# Patient Record
Sex: Male | Born: 1995 | Race: Black or African American | Hispanic: No | Marital: Single | Smoking: Never smoker
Health system: Southern US, Community
[De-identification: ages and names within clinical notes are randomized; demographics above are authoritative.]

## PROBLEM LIST (undated history)

## (undated) DIAGNOSIS — B019 Varicella without complication: Secondary | ICD-10-CM

## (undated) HISTORY — DX: Varicella without complication: B01.9

---
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
## 2013-10-23 ENCOUNTER — Ambulatory Visit (INDEPENDENT_AMBULATORY_CARE_PROVIDER_SITE_OTHER): Payer: BC Managed Care – PPO | Admitting: Family Medicine

## 2013-10-23 ENCOUNTER — Encounter: Payer: Self-pay | Admitting: Family Medicine

## 2013-10-23 VITALS — BP 127/65 | HR 71 | Temp 98.3°F | Ht 71.5 in | Wt 180.0 lb

## 2013-10-23 DIAGNOSIS — L259 Unspecified contact dermatitis, unspecified cause: Secondary | ICD-10-CM

## 2013-10-23 DIAGNOSIS — L309 Dermatitis, unspecified: Secondary | ICD-10-CM

## 2013-10-23 MED ORDER — TRIAMCINOLONE ACETONIDE 0.1 % EX CREA: 1.0000 "application " | TOPICAL_CREAM | Freq: Two times a day (BID) | CUTANEOUS | Status: DC

## 2013-10-23 NOTE — Progress Notes (Signed)
Pre visit review using our clinic review tool, if applicable. No additional management support is needed unless otherwise documented below in the visit note. 

## 2013-10-23 NOTE — Progress Notes (Signed)
   Subjective:    Patient ID: Reginald Livingston, male    DOB: 03-Dec-1995, 18 y.o.   MRN: [id number]  HPI Here for 2 weeks of itchy patches of skin on the arms and trunk.    Review of Systems  Constitutional: Negative.   Skin: Positive for rash.       Objective:   Physical Exam  Constitutional: He appears well-developed and well-nourished.  Skin:  Patches of macular erythema on the arms and trunk          Assessment & Plan:  Try triamcinolone cream.

## 2014-09-25 ENCOUNTER — Encounter: Payer: Self-pay | Admitting: Family Medicine

## 2014-09-25 ENCOUNTER — Ambulatory Visit (INDEPENDENT_AMBULATORY_CARE_PROVIDER_SITE_OTHER): Payer: BC Managed Care – PPO | Admitting: Family Medicine

## 2014-09-25 VITALS — BP 141/72 | HR 61 | Temp 98.1°F | Ht 71.5 in | Wt 179.0 lb

## 2014-09-25 DIAGNOSIS — Z202 Contact with and (suspected) exposure to infections with a predominantly sexual mode of transmission: Secondary | ICD-10-CM | POA: Diagnosis not present

## 2014-09-25 DIAGNOSIS — Z0000 Encounter for general adult medical examination without abnormal findings: Secondary | ICD-10-CM

## 2014-09-25 NOTE — Progress Notes (Signed)
Pre visit review using our clinic review tool, if applicable. No additional management support is needed unless otherwise documented below in the visit note. 

## 2014-09-25 NOTE — Progress Notes (Signed)
   Subjective:    Patient ID: Reginald Livingston, male    DOB: [birth year], 19 y.o.   MRN: [id number]  HPI 19 yr old male for a cpx. He feels well. He does ask for some STD screens for peace of mind. He is a Holiday representativesenior in college but he plans to start graduate school next year for an MBA degree.    Review of Systems  Constitutional: Negative.   HENT: Negative.   Eyes: Negative.   Respiratory: Negative.   Cardiovascular: Negative.   Gastrointestinal: Negative.   Genitourinary: Negative.   Musculoskeletal: Negative.   Skin: Negative.   Neurological: Negative.   Psychiatric/Behavioral: Negative.        Objective:   Physical Exam  Constitutional: He is oriented to person, place, and time. He appears well-developed and well-nourished. No distress.  HENT:  Head: Normocephalic and atraumatic.  Right Ear: External ear normal.  Left Ear: External ear normal.  Nose: Nose normal.  Mouth/Throat: Oropharynx is clear and moist. No oropharyngeal exudate.  Eyes: Conjunctivae and EOM are normal. Pupils are equal, round, and reactive to light. Right eye exhibits no discharge. Left eye exhibits no discharge. No scleral icterus.  Neck: Neck supple. No JVD present. No tracheal deviation present. No thyromegaly present.  Cardiovascular: Normal rate, regular rhythm, normal heart sounds and intact distal pulses.  Exam reveals no gallop and no friction rub.   No murmur heard. Pulmonary/Chest: Effort normal and breath sounds normal. No respiratory distress. He has no wheezes. He has no rales. He exhibits no tenderness.  Abdominal: Soft. Bowel sounds are normal. He exhibits no distension and no mass. There is no tenderness. There is no rebound and no guarding.  Genitourinary: Rectum normal, prostate normal and penis normal. Guaiac negative stool. No penile tenderness.  Musculoskeletal: Normal range of motion. He exhibits no edema or tenderness.  Lymphadenopathy:    He has no cervical adenopathy.  Neurological: He  is alert and oriented to person, place, and time. He has normal reflexes. No cranial nerve deficit. He exhibits normal muscle tone. Coordination normal.  Skin: Skin is warm and dry. No rash noted. He is not diaphoretic. No erythema. No pallor.  Psychiatric: He has a normal mood and affect. His behavior is normal. Judgment and thought content normal.          Assessment & Plan:  Well exam. Get some STD screens today.

## 2014-09-26 LAB — HIV ANTIBODY (ROUTINE TESTING W REFLEX): HIV 1&2 Ab, 4th Generation: NONREACTIVE

## 2014-09-26 LAB — HEPATITIS B SURFACE ANTIGEN: Hepatitis B Surface Ag: NEGATIVE

## 2014-09-26 LAB — GC/CHLAMYDIA PROBE AMP, URINE
Chlamydia, Swab/Urine, PCR: NEGATIVE
GC Probe Amp, Urine: NEGATIVE

## 2014-09-26 LAB — RPR

## 2014-09-26 LAB — HEPATITIS C ANTIBODY: HCV AB: NEGATIVE

## 2014-09-26 LAB — HSV(HERPES SIMPLEX VRS) I + II AB-IGG
HSV 1 Glycoprotein G Ab, IgG: <0.1 IV
HSV 2 Glycoprotein G Ab, IgG: <0.1 IV

## 2014-10-29 ENCOUNTER — Encounter: Payer: Self-pay | Admitting: Family Medicine

## 2014-10-29 ENCOUNTER — Ambulatory Visit (INDEPENDENT_AMBULATORY_CARE_PROVIDER_SITE_OTHER): Payer: BC Managed Care – PPO | Admitting: Family Medicine

## 2014-10-29 VITALS — BP 124/72 | HR 81 | Temp 97.9°F | Wt 182.0 lb

## 2014-10-29 DIAGNOSIS — Z202 Contact with and (suspected) exposure to infections with a predominantly sexual mode of transmission: Secondary | ICD-10-CM

## 2014-10-29 NOTE — Progress Notes (Signed)
Pre visit review using our clinic review tool, if applicable. No additional management support is needed unless otherwise documented below in the visit note. 

## 2014-10-29 NOTE — Progress Notes (Signed)
   Subjective:    Patient ID: Reginald Livingston, male    DOB: 1996-01-03, 19 y.o.   MRN: [id number]  HPI Here to be checked for possible STDs. 3 days ago he had unprotected sex and now he is worried about it. He denies any symptoms.    Review of Systems  Constitutional: Negative.   Genitourinary: Negative.        Objective:   Physical Exam  Constitutional: He appears well-developed and well-nourished.          Assessment & Plan:  Test for STDs. We strongly advised him to wear condoms in the future.

## 2014-10-30 LAB — HSV(HERPES SIMPLEX VRS) I + II AB-IGG
HSV 1 Glycoprotein G Ab, IgG: <0.1 IV
HSV 2 GLYCOPROTEIN G AB, IGG: 0.12 IV

## 2014-10-30 LAB — RPR

## 2014-10-30 LAB — GC/CHLAMYDIA PROBE AMP, URINE
CHLAMYDIA, SWAB/URINE, PCR: NEGATIVE
GC Probe Amp, Urine: NEGATIVE

## 2014-10-30 LAB — HEPATITIS B SURFACE ANTIGEN: Hepatitis B Surface Ag: NEGATIVE

## 2014-10-30 LAB — HIV ANTIBODY (ROUTINE TESTING W REFLEX): HIV: NONREACTIVE

## 2014-10-30 LAB — HEPATITIS C ANTIBODY: HCV Ab: NEGATIVE

## 2015-04-08 ENCOUNTER — Encounter: Payer: Self-pay | Admitting: Family Medicine

## 2015-04-08 ENCOUNTER — Ambulatory Visit (INDEPENDENT_AMBULATORY_CARE_PROVIDER_SITE_OTHER): Payer: BC Managed Care – PPO | Admitting: Family Medicine

## 2015-04-08 VITALS — BP 133/76 | HR 65 | Temp 98.8°F | Ht 71.5 in | Wt 183.0 lb

## 2015-04-08 DIAGNOSIS — B360 Pityriasis versicolor: Secondary | ICD-10-CM

## 2015-04-08 MED ORDER — FLUCONAZOLE 100 MG PO TABS: 100.0000 mg | ORAL_TABLET | Freq: Two times a day (BID) | ORAL | Status: DC

## 2015-04-08 NOTE — Progress Notes (Signed)
Pre visit review using our clinic review tool, if applicable. No additional management support is needed unless otherwise documented below in the visit note. 

## 2015-04-08 NOTE — Progress Notes (Signed)
   Subjective:    Patient ID: Reginald Livingston, male    DOB: Nov 01, 1995, 19 y.o.   MRN: [id number]  HPI Here for one month of a rash on the neck and upper back. It does not itch. Triamcinolone cream does not help.   Review of Systems  Constitutional: Negative.   Skin: Positive for rash.       Objective:   Physical Exam  Constitutional: He appears well-developed and well-nourished.  Skin:  Large patches of hypopigmented skin on  The upper back           Assessment & Plan:  Tinea versicolor, treat with Selsun Blu shampoo BID. Also take Fluconazole tabs bid for 30 days

## 2015-06-16 ENCOUNTER — Telehealth: Payer: Self-pay | Admitting: Family Medicine

## 2015-06-16 ENCOUNTER — Ambulatory Visit: Payer: BC Managed Care – PPO | Admitting: Family Medicine

## 2015-06-16 NOTE — Telephone Encounter (Signed)
Pt was on schedule to see Dr. Clent Ridges today, I called and left a voice message for pt to reschedule this missed appointment.

## 2015-06-17 ENCOUNTER — Other Ambulatory Visit: Payer: Self-pay | Admitting: Family Medicine

## 2015-06-17 ENCOUNTER — Encounter: Payer: Self-pay | Admitting: Family Medicine

## 2015-06-17 ENCOUNTER — Ambulatory Visit (INDEPENDENT_AMBULATORY_CARE_PROVIDER_SITE_OTHER): Payer: BC Managed Care – PPO | Admitting: Family Medicine

## 2015-06-17 VITALS — BP 130/72 | HR 66 | Temp 98.7°F | Ht 71.5 in | Wt 180.0 lb

## 2015-06-17 DIAGNOSIS — Z209 Contact with and (suspected) exposure to unspecified communicable disease: Secondary | ICD-10-CM | POA: Diagnosis not present

## 2015-06-17 NOTE — Progress Notes (Signed)
   Subjective:    Patient ID: Reginald Livingston, male    DOB: [birth year], 20 y.o.   MRN: [id number]  HPI Here asking for STD testing. He has no symptoms, but he and his girlfriend split up a few months ago and each of them saw different partners during this time. Now the two of them are back together and they both agreed to get STD testing to make the other one feel safe.    Review of Systems  Constitutional: Negative.   Respiratory: Negative.   Cardiovascular: Negative.   Genitourinary: Negative.   Neurological: Negative.        Objective:   Physical Exam  Constitutional: He appears well-developed and well-nourished.  Cardiovascular: Normal rate, regular rhythm, normal heart sounds and intact distal pulses.   Pulmonary/Chest: Effort normal and breath sounds normal.  Genitourinary: Rectum normal and penis normal.  Skin: No rash noted.          Assessment & Plan:  STD testing. We will send him for a full panel.

## 2015-06-17 NOTE — Progress Notes (Signed)
Pre visit review using our clinic review tool, if applicable. No additional management support is needed unless otherwise documented below in the visit note. 

## 2015-06-18 LAB — HEPATITIS B SURFACE ANTIGEN: Hepatitis B Surface Ag: NEGATIVE

## 2015-06-18 LAB — HSV(HERPES SIMPLEX VRS) I + II AB-IGG: HSV 1 Glycoprotein G Ab, IgG: <0.1 IV

## 2015-06-18 LAB — HEPATITIS C ANTIBODY: HCV Ab: NEGATIVE

## 2015-06-18 LAB — HIV ANTIBODY (ROUTINE TESTING W REFLEX): HIV: NONREACTIVE

## 2015-06-18 LAB — RPR

## 2015-06-18 LAB — GC/CHLAMYDIA PROBE AMP
CT PROBE, AMP APTIMA: NOT DETECTED
GC PROBE AMP APTIMA: NOT DETECTED

## 2016-09-12 ENCOUNTER — Emergency Department (HOSPITAL_COMMUNITY): Payer: BC Managed Care – PPO

## 2016-09-12 ENCOUNTER — Emergency Department (HOSPITAL_COMMUNITY)
Admission: EM | Admit: 2016-09-12 | Discharge: 2016-09-12 | Disposition: A | Discharge status: Home / Self Care | Payer: BC Managed Care – PPO | Attending: Emergency Medicine | Admitting: Emergency Medicine

## 2016-09-12 ENCOUNTER — Encounter (HOSPITAL_COMMUNITY): Payer: Self-pay

## 2016-09-12 DIAGNOSIS — R51 Headache: Secondary | ICD-10-CM | POA: Insufficient documentation

## 2016-09-12 DIAGNOSIS — S3992XA Unspecified injury of lower back, initial encounter: Secondary | ICD-10-CM | POA: Diagnosis present

## 2016-09-12 DIAGNOSIS — Y999 Unspecified external cause status: Secondary | ICD-10-CM | POA: Insufficient documentation

## 2016-09-12 DIAGNOSIS — R519 Headache, unspecified: Secondary | ICD-10-CM

## 2016-09-12 DIAGNOSIS — Y939 Activity, unspecified: Secondary | ICD-10-CM | POA: Insufficient documentation

## 2016-09-12 DIAGNOSIS — S39012A Strain of muscle, fascia and tendon of lower back, initial encounter: Secondary | ICD-10-CM | POA: Insufficient documentation

## 2016-09-12 DIAGNOSIS — Y92410 Unspecified street and highway as the place of occurrence of the external cause: Secondary | ICD-10-CM | POA: Insufficient documentation

## 2016-09-12 MED ORDER — METHOCARBAMOL 500 MG PO TABS
500.0000 mg | ORAL_TABLET | Freq: Once | ORAL | Status: AC
  Administered 2016-09-12: 500 mg via ORAL
  Filled 2016-09-12: qty 1

## 2016-09-12 MED ORDER — NAPROXEN 375 MG PO TABS: 375.0000 mg | ORAL_TABLET | Freq: Two times a day (BID) | ORAL | 0 refills | Status: DC

## 2016-09-12 MED ORDER — METHOCARBAMOL 500 MG PO TABS: 500.0000 mg | ORAL_TABLET | Freq: Two times a day (BID) | ORAL | 0 refills | Status: DC

## 2016-09-12 MED ORDER — KETOROLAC TROMETHAMINE 60 MG/2ML IM SOLN
30.0000 mg | Freq: Once | INTRAMUSCULAR | Status: AC
  Administered 2016-09-12: 30 mg via INTRAMUSCULAR
  Filled 2016-09-12: qty 2
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
## 2016-09-12 NOTE — Discharge Instructions (Signed)
X-ray shows no fracture. This is likely a musculoskeletal sprain. Please take the naproxen as prescribed for anti-inflammatory. May take Tylenol for pain. She is the Robaxin for muscle relaxation this will make you drowsy so do not drive with it. Heating pad to the affected area. Warm soaks in Epsom salt. Follow-up with primary care doctor is symptoms are not improving. Return to ED if your symptoms worsen.

## 2016-09-12 NOTE — ED Notes (Signed)
Pt stable, ambulatory, states understanding of discharge instructions 
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
## 2016-12-10 ENCOUNTER — Encounter: Payer: Self-pay | Admitting: Family Medicine

## 2016-12-10 ENCOUNTER — Ambulatory Visit (INDEPENDENT_AMBULATORY_CARE_PROVIDER_SITE_OTHER): Payer: BC Managed Care – PPO | Admitting: Family Medicine

## 2016-12-10 VITALS — BP 101/65 | HR 64 | Temp 98.4°F | Ht 71.5 in | Wt 176.0 lb

## 2016-12-10 DIAGNOSIS — M25571 Pain in right ankle and joints of right foot: Secondary | ICD-10-CM

## 2016-12-10 NOTE — Progress Notes (Signed)
   Subjective:    Patient ID: Reginald FlashKevin S Bose, male    DOB: 24-Oct-1995, 21 y.o.   MRN: [id number]  HPI Here for one week of pain in the right medial ankle. No hx of trauma. There has been no swelling. He does work out every day.    Review of Systems  Constitutional: Negative.   Respiratory: Negative.   Cardiovascular: Negative.   Musculoskeletal: Positive for arthralgias.       Objective:   Physical Exam  Constitutional: He appears well-developed and well-nourished.  Cardiovascular: Normal rate, regular rhythm, normal heart sounds and intact distal pulses.   Pulmonary/Chest: Effort normal and breath sounds normal.  Musculoskeletal:  The right medial ankle appears normal, no swelling, no erythema or warmth, full ROM. He is tender directly over the distal fibula          Assessment & Plan:  Ankle pain or uncertain etiology. Use ice packs and take Aleve 2 tabs bid for a few days. Recheck prn.  Gershon CraneStephen Johnice Riebe, MD

## 2016-12-10 NOTE — Patient Instructions (Signed)
WE NOW OFFER   [facility] FAST TRACK!!!  SAME DAY Appointments for ACUTE CARE  Such as: Sprains, Injuries, cuts, abrasions, rashes, muscle pain, joint pain, back pain Colds, flu, sore throats, headache, allergies, cough, fever  Ear pain, sinus and eye infections Abdominal pain, nausea, vomiting, diarrhea, upset stomach Animal/insect bites  3 Easy Ways to Schedule: Walk-In Scheduling Call in scheduling Mychart Sign-up: [email or web address]         
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
## 2019-08-07 ENCOUNTER — Ambulatory Visit: Payer: Self-pay | Attending: Family

## 2019-08-07 DIAGNOSIS — Z23 Encounter for immunization: Secondary | ICD-10-CM

## 2019-08-07 NOTE — Progress Notes (Signed)
   Covid-19 Vaccination Clinic  Name:  Reginald Livingston    MRN: [id number] DOB: 11/15/1995  08/07/2019  [name] was observed post Covid-19 immunization for 15 minutes without incident. He was provided with Vaccine Information Sheet and instruction to access the V-Safe system.   [name] was instructed to call 911 with any severe reactions post vaccine: Marland Kitchen Difficulty breathing  . Swelling of face and throat  . A fast heartbeat  . A bad rash all over body  . Dizziness and weakness   Immunizations Administered    Name Date Dose VIS Date Route   Moderna COVID-19 Vaccine 08/07/2019  4:38 PM 0.5 mL 04/24/2019 Intramuscular   Manufacturer: Moderna   Lot: [id number]   NDC: 03979-536-92

## 2019-09-11 ENCOUNTER — Ambulatory Visit: Payer: Self-pay

## 2019-09-11 ENCOUNTER — Ambulatory Visit: Payer: Self-pay | Attending: Family

## 2019-09-11 DIAGNOSIS — Z23 Encounter for immunization: Secondary | ICD-10-CM

## 2019-09-11 NOTE — Progress Notes (Signed)
   Covid-19 Vaccination Clinic  Name:  Reginald Livingston    MRN: [id number] DOB: [birth year]  09/11/2019  [name] was observed post Covid-19 immunization for 15 minutes without incident. He was provided with Vaccine Information Sheet and instruction to access the V-Safe system.   [name] was instructed to call 911 with any severe reactions post vaccine: Marland Kitchen Difficulty breathing  . Swelling of face and throat  . A fast heartbeat  . A bad rash all over body  . Dizziness and weakness   Immunizations Administered    Name Date Dose VIS Date Route   Moderna COVID-19 Vaccine 09/11/2019 10:09 AM 0.5 mL 04/2019 Intramuscular   Manufacturer: Moderna   Lot: [id number]   NDC: 79444-619-01

## 2020-03-25 ENCOUNTER — Other Ambulatory Visit: Payer: Self-pay

## 2020-03-25 ENCOUNTER — Encounter (HOSPITAL_COMMUNITY): Payer: Self-pay

## 2020-03-25 ENCOUNTER — Ambulatory Visit (HOSPITAL_COMMUNITY)
Admission: EM | Admit: 2020-03-25 | Discharge: 2020-03-25 | Disposition: A | Discharge status: Home / Self Care | Payer: Self-pay | Attending: Family Medicine | Admitting: Family Medicine

## 2020-03-25 DIAGNOSIS — M25512 Pain in left shoulder: Secondary | ICD-10-CM

## 2020-03-25 DIAGNOSIS — R0789 Other chest pain: Secondary | ICD-10-CM

## 2020-03-25 MED ORDER — TIZANIDINE HCL 4 MG PO TABS: 4.0000 mg | ORAL_TABLET | Freq: Four times a day (QID) | ORAL | 0 refills | Status: DC | PRN

## 2020-03-25 MED ORDER — IBUPROFEN 600 MG PO TABS: 600.0000 mg | ORAL_TABLET | Freq: Three times a day (TID) | ORAL | 0 refills | Status: DC | PRN

## 2020-03-25 NOTE — Discharge Instructions (Signed)
Ibuprofen for pain and inflammation Zanaflex for muscle relaxant as needed.  Rest, Ice,  Follow up as needed for continued or worsening symptoms
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
## 2021-01-13 ENCOUNTER — Other Ambulatory Visit: Payer: Self-pay

## 2021-01-13 ENCOUNTER — Ambulatory Visit (INDEPENDENT_AMBULATORY_CARE_PROVIDER_SITE_OTHER): Payer: Self-pay | Admitting: Family Medicine

## 2021-01-13 ENCOUNTER — Encounter: Payer: Self-pay | Admitting: Family Medicine

## 2021-01-13 VITALS — BP 128/86 | HR 61 | Temp 98.7°F | Wt 194.0 lb

## 2021-01-13 DIAGNOSIS — Z209 Contact with and (suspected) exposure to unspecified communicable disease: Secondary | ICD-10-CM

## 2021-01-13 NOTE — Progress Notes (Signed)
   Subjective:    Patient ID: Reginald Livingston, male    DOB: [birth year], 25 y.o.   MRN: [id number]  HPI Here asking for STD testing. He feels fine, but he is concerned because his girlfriend recently broke up with him.    Review of Systems  Constitutional: Negative.   Respiratory: Negative.    Cardiovascular: Negative.   Genitourinary: Negative.       Objective:   Physical Exam Constitutional:      Appearance: Normal appearance.  Cardiovascular:     Rate and Rhythm: Normal rate and regular rhythm.     Pulses: Normal pulses.     Heart sounds: Normal heart sounds.  Pulmonary:     Effort: Pulmonary effort is normal.     Breath sounds: Normal breath sounds.  Neurological:     Mental Status: He is alert.          Assessment & Plan:  Exposure to STD's. We will test for these today. Gershon Crane, MD

## 2021-01-13 NOTE — Addendum Note (Signed)
Addended by: Kandra Nicolas on: 01/13/2021 04:25 PM   Modules accepted: Orders

## 2021-01-14 LAB — C. TRACHOMATIS/N. GONORRHOEAE RNA
C. trachomatis RNA, TMA: NOT DETECTED
N. gonorrhoeae RNA, TMA: NOT DETECTED

## 2021-01-14 LAB — HSV(HERPES SIMPLEX VRS) I + II AB-IGG
HAV 1 IGG,TYPE SPECIFIC AB: 2.5 index — ABNORMAL HIGH
HSV 2 IGG,TYPE SPECIFIC AB: <0.9 index

## 2021-01-14 LAB — HIV ANTIBODY (ROUTINE TESTING W REFLEX): HIV 1&2 Ab, 4th Generation: NONREACTIVE

## 2021-01-14 LAB — RPR: RPR Ser Ql: NONREACTIVE

## 2021-01-15 ENCOUNTER — Other Ambulatory Visit: Payer: Self-pay

## 2021-01-15 MED ORDER — VALACYCLOVIR HCL 500 MG PO TABS: 500.0000 mg | ORAL_TABLET | Freq: Every day | ORAL | 3 refills | Status: DC

## 2022-01-08 ENCOUNTER — Other Ambulatory Visit: Payer: Self-pay | Admitting: Family Medicine

## 2022-01-30 ENCOUNTER — Other Ambulatory Visit: Payer: Self-pay | Admitting: Family Medicine

## 2022-02-17 ENCOUNTER — Other Ambulatory Visit: Payer: Self-pay | Admitting: Family Medicine

## 2022-06-22 DIAGNOSIS — H6123 Impacted cerumen, bilateral: Secondary | ICD-10-CM | POA: Diagnosis not present

## 2022-08-05 ENCOUNTER — Encounter: Payer: Self-pay | Admitting: Family Medicine

## 2022-08-05 ENCOUNTER — Ambulatory Visit (INDEPENDENT_AMBULATORY_CARE_PROVIDER_SITE_OTHER): Payer: BC Managed Care – PPO | Admitting: Family Medicine

## 2022-08-05 VITALS — BP 124/80 | HR 68 | Temp 97.5°F | Wt 203.0 lb

## 2022-08-05 DIAGNOSIS — H6123 Impacted cerumen, bilateral: Secondary | ICD-10-CM

## 2022-08-05 DIAGNOSIS — H9202 Otalgia, left ear: Secondary | ICD-10-CM | POA: Diagnosis not present

## 2022-08-05 NOTE — Progress Notes (Signed)
   Subjective:    Patient ID: Reginald Livingston, male    DOB: [birth year], 27 y.o.   MRN: [id number]  HPI Here for pain in the left ear. On 06-22-22 he saw Reginald Provost PA at [facility] for bilateral cerumen impactions. She attempted to remove the cerumen, but she was unable to clear either ear. She advised him to begin using peroxide drops in the ear, and to return in a few weeks to try cleaning them again. However several days ago he developed pain in the left ear. He spoke to his aunt, who is a NP, and she called in 7 days of Augmentin and Ciprodex drops for him to use. He has ben using these for the past 36 hours, but the pain has gotten worse. No fever.    Review of Systems  Constitutional: Negative.   HENT:  Positive for ear pain and hearing loss. Negative for congestion, ear discharge, postnasal drip, sinus pain and sore throat.   Eyes: Negative.   Respiratory: Negative.         Objective:   Physical Exam Constitutional:      Appearance: Normal appearance.  HENT:     Right Ear: There is impacted cerumen.     Left Ear: There is impacted cerumen.     Ears:     Comments: Manipulating the right ear lobe is painful     Nose: Nose normal.     Mouth/Throat:     Pharynx: Oropharynx is clear.  Eyes:     Conjunctiva/sclera: Conjunctivae normal.  Pulmonary:     Effort: Pulmonary effort is normal.     Breath sounds: Normal breath sounds.  Lymphadenopathy:     Cervical: No cervical adenopathy.  Neurological:     Mental Status: He is alert.           Assessment & Plan:  He has bilateral cerumen impactions, and apparently has a left otitis externa now also. We will send him back to [facility] today to evaluate.  Alysia Penna, MD

## 2022-08-06 DIAGNOSIS — H6123 Impacted cerumen, bilateral: Secondary | ICD-10-CM | POA: Diagnosis not present

## 2022-08-06 DIAGNOSIS — H60332 Swimmer's ear, left ear: Secondary | ICD-10-CM | POA: Diagnosis not present

## 2022-08-12 DIAGNOSIS — H60332 Swimmer's ear, left ear: Secondary | ICD-10-CM | POA: Diagnosis not present

## 2023-04-07 ENCOUNTER — Encounter: Payer: Self-pay | Admitting: Family Medicine

## 2023-04-07 ENCOUNTER — Ambulatory Visit (INDEPENDENT_AMBULATORY_CARE_PROVIDER_SITE_OTHER): Payer: Self-pay | Admitting: Family Medicine

## 2023-04-07 VITALS — BP 124/80 | HR 68 | Temp 98.2°F | Wt 200.0 lb

## 2023-04-07 DIAGNOSIS — N342 Other urethritis: Secondary | ICD-10-CM

## 2023-04-07 LAB — POC URINALSYSI DIPSTICK (AUTOMATED)
Bilirubin, UA: NEGATIVE
Blood, UA: NEGATIVE
Glucose, UA: NEGATIVE
Ketones, UA: NEGATIVE
Leukocytes, UA: NEGATIVE
Nitrite, UA: NEGATIVE
Protein, UA: POSITIVE — AB
Spec Grav, UA: 1.025 (ref 1.010–1.025)
Urobilinogen, UA: 0.2 U/dL
pH, UA: 5 (ref 5.0–8.0)

## 2023-04-07 MED ORDER — DOXYCYCLINE HYCLATE 100 MG PO TABS: 100.0000 mg | ORAL_TABLET | Freq: Two times a day (BID) | ORAL | 0 refills | Status: DC

## 2023-04-07 NOTE — Progress Notes (Signed)
   Subjective:    Patient ID: Reginald Livingston, male    DOB: [birth year], 27 y.o.   MRN: [id number]  HPI Here for 2 days of burning with urination. No urethral DC. No back pain or fever. The last time he had sex was one week ago with the person he has been seeing for some time.    Review of Systems  Constitutional: Negative.   Respiratory: Negative.    Cardiovascular: Negative.   Gastrointestinal: Negative.   Genitourinary:  Positive for dysuria. Negative for flank pain, frequency, hematuria, penile discharge, testicular pain and urgency.       Objective:   Physical Exam Constitutional:      Appearance: Normal appearance. He is not ill-appearing.  Cardiovascular:     Rate and Rhythm: Normal rate and regular rhythm.     Pulses: Normal pulses.     Heart sounds: Normal heart sounds.  Pulmonary:     Effort: Pulmonary effort is normal.     Breath sounds: Normal breath sounds.  Genitourinary:    Penis: Normal.      Testes: Normal.  Neurological:     Mental Status: He is alert.           Assessment & Plan:  Urethritis, possibly Chlamydia. We will treat this with 10 days of Doxycycline. We will also culture the urine and will test for Chlamydia, gonorrhea, syphilis, HIV, and Trichomonas.  Gershon Crane, MD

## 2023-04-07 NOTE — Addendum Note (Signed)
Addended by: Carola Rhine on: 04/07/2023 09:06 AM   Modules accepted: Orders

## 2023-04-08 ENCOUNTER — Telehealth: Payer: Self-pay | Admitting: Family Medicine

## 2023-04-08 LAB — URINE CULTURE
MICRO NUMBER:: 15731091
Result:: NO GROWTH
SPECIMEN QUALITY:: ADEQUATE

## 2023-04-08 LAB — C. TRACHOMATIS/N. GONORRHOEAE RNA
C. trachomatis RNA, TMA: NOT DETECTED
N. gonorrhoeae RNA, TMA: NOT DETECTED

## 2023-04-08 NOTE — Addendum Note (Signed)
Addended by: Sumner Boast on: 04/08/2023 04:07 PM   Modules accepted: Orders

## 2023-04-08 NOTE — Telephone Encounter (Signed)
Spoke with pt stated that he received a call to come back to the lab. Confirmed with our lab that [facility] needed another urine sample on pt verbalized understanding

## 2023-04-08 NOTE — Telephone Encounter (Signed)
Pt was seen by MD yesterday, 04/07/23. Pt stated he has a question for MD, and would like a call back to discuss.  Pt did not want to disclose reason for call, stating it was personal.

## 2023-04-09 LAB — URINALYSIS, MICROSCOPIC ONLY
Bacteria, UA: NONE SEEN /[HPF]
Hyaline Cast: NONE SEEN /[LPF]
RBC / HPF: NONE SEEN /[HPF] (ref 0–2)
Squamous Epithelial / HPF: NONE SEEN /[HPF] (ref <=5)

## 2023-04-09 LAB — RPR: RPR Ser Ql: NONREACTIVE

## 2023-04-09 LAB — HIV ANTIBODY (ROUTINE TESTING W REFLEX): HIV 1&2 Ab, 4th Generation: NONREACTIVE

## 2023-04-11 ENCOUNTER — Telehealth: Payer: Self-pay | Admitting: Family Medicine

## 2023-04-11 NOTE — Telephone Encounter (Signed)
Pt called, requesting to speak to CMA, regarding lab results. CMA was unavailable. Pt asked that CMA call back at her earliest convenience.

## 2023-04-12 ENCOUNTER — Other Ambulatory Visit: Payer: Self-pay

## 2023-04-12 MED ORDER — CIPROFLOXACIN HCL 500 MG PO TABS
500.0000 mg | ORAL_TABLET | Freq: Two times a day (BID) | ORAL | 0 refills | Status: AC
Start: 2023-04-12 — End: 2023-04-22

## 2023-04-12 NOTE — Telephone Encounter (Signed)
Pt new Rx sent to pt pharmacy, left detailed message for pt advised of Dr Clent Ridges recommendation

## 2023-04-13 ENCOUNTER — Telehealth: Payer: Self-pay | Admitting: Family Medicine

## 2023-04-13 NOTE — Telephone Encounter (Signed)
Reviewed Dr Clent Ridges advise with pt verbalized understanding, pt pick up Rx from pharmacy

## 2023-04-13 NOTE — Telephone Encounter (Signed)
Reviewed Dr Clent Ridges advise with pt, verbalized understanding

## 2023-04-13 NOTE — Telephone Encounter (Signed)
Pt called, returning CMA's call. CMA was with a patient. Pt asked that CMA call back at her earliest convenience. 

## 2023-04-14 ENCOUNTER — Other Ambulatory Visit: Payer: Self-pay

## 2023-04-25 ENCOUNTER — Ambulatory Visit: Payer: Self-pay | Admitting: Family Medicine

## 2023-04-27 ENCOUNTER — Ambulatory Visit: Payer: Self-pay | Admitting: Family Medicine

## 2023-04-28 ENCOUNTER — Telehealth: Payer: Self-pay | Admitting: Family Medicine

## 2023-04-28 NOTE — Telephone Encounter (Signed)
Prescription Request  04/28/2023  LOV: 04/07/2023  What is the name of the medication or equipment? Ciprofloxacin. Pt states he lost medication on his flight and is requesting a refill.   Have you contacted your pharmacy to request a refill? No   Which pharmacy would you like this sent to? [id number] [place], [place] - [address] [place] [address] Phone: [phone] Fax: [phone]     Patient notified that their request is being sent to the clinical staff for review and that they should receive a response within 2 business days.   Please advise at Mobile [phone] (mobile)

## 2023-04-29 ENCOUNTER — Other Ambulatory Visit: Payer: Self-pay

## 2023-04-29 MED ORDER — DOXYCYCLINE HYCLATE 100 MG PO TABS: 100.0000 mg | ORAL_TABLET | Freq: Two times a day (BID) | ORAL | 0 refills | Status: DC

## 2023-04-29 NOTE — Telephone Encounter (Signed)
Pt new Rx sent and pt notified

## 2023-04-29 NOTE — Telephone Encounter (Signed)
Rather than more Cipro, call in Doxycycline 100 mg BID for 10 days

## 2024-04-16 ENCOUNTER — Ambulatory Visit: Payer: Self-pay

## 2024-04-16 NOTE — Telephone Encounter (Signed)
 FYI Only or Action Required?: FYI only for provider: appointment scheduled on 04/17/24.  Patient was last seen in primary care on 04/07/2023 by Johnny Garnette LABOR, MD.  Called Nurse Triage reporting Rash.  Symptoms began several days ago.  Interventions attempted: Nothing.  Symptoms are: unchanged.  Triage Disposition: See Physician Within 24 Hours  Patient/caregiver understands and will follow disposition?: Yes   Copied from CRM #[id number]. Topic: Clinical - Red Word Triage >> Apr 16, 2024 11:33 AM Harlene ORN wrote: Red Word that prompted transfer to Nurse Triage:  Patient is having a worsening rash spreading in genital area; painful would like to be seen today. out of controlled substance cream that releived the rash. Reason for Disposition  Rash with painful tiny water blisters or painful sores  Answer Assessment - Initial Assessment Questions 1. CALLER DIAGNOSIS: What do you think is causing the rash? (e.g., athlete's foot, chickenpox, hives, impetigo)     Outbreak 2. LOCALIZED OR WIDESPREAD:  Is the rash all over (widespread) or mostly just in one area of the body (localized)?      Genital area left side, mild pain 3. NEW MEDICINES: Are you taking any new medicine?     valtrex  4. APPEARANCE of RASH: What does the rash look like? What color is it?  Note: It is difficult to assess rash color in people with darker-colored skin. When this situation occurs, simply ask the caller to describe what they see.     Red, blisters; no itching; drainage 5. FEVER: Do you have a fever? If Yes, ask: What is your temperature, how was it measured, and when did it start?     Denies fever, chills, n/v  Answer Assessment - Initial Assessment Questions 1. SYMPTOM: What's the main symptom you're concerned about? (e.g., blood in semen, discharge or pus from penis, itching, pain, rash, swelling)     no 2. LOCATION: Where is the  located?     genital      Genital area left side, mild  pain Red, blisters; no itching; drainage 4. PAIN: Is there any pain? If Yes, ask: How bad is it?  (Scale 1-10; or mild, moderate, severe)     denies 5. URINE: Any difficulty passing urine? If Yes, ask: When was the last time?     no 6. CAUSE: What do you think is causing the symptoms?     Outbreak; request valtrex  7. OTHER SYMPTOMS: Do you have any other symptoms? (e.g., blood in urine, abdomen pain, fever)  Denies fever, chills, n/v  Protocols used: Rash - Guideline Selection-A-AH, Penis and Scrotum Symptoms-A-AH

## 2024-04-17 ENCOUNTER — Ambulatory Visit (INDEPENDENT_AMBULATORY_CARE_PROVIDER_SITE_OTHER): Payer: Self-pay | Admitting: Family Medicine

## 2024-04-17 VITALS — BP 128/78 | HR 65 | Temp 97.6°F | Wt 197.0 lb

## 2024-04-17 DIAGNOSIS — R21 Rash and other nonspecific skin eruption: Secondary | ICD-10-CM

## 2024-04-17 MED ORDER — VALACYCLOVIR HCL 500 MG PO TABS: 500.0000 mg | ORAL_TABLET | Freq: Two times a day (BID) | ORAL | 2 refills | Status: AC

## 2024-04-17 NOTE — Telephone Encounter (Signed)
 Pt was seen by Dr Johnny this morning for this problem

## 2024-04-17 NOTE — Progress Notes (Signed)
   Subjective:    Patient ID: Reginald Livingston, male    DOB: 25-Feb-1996, 28 y.o.   MRN: [id number]  HPI Here for 4 days of an area of bumps in the peroneal area that burns a little. He does not remember ever having this before.     Review of Systems  Constitutional: Negative.   Respiratory: Negative.    Cardiovascular: Negative.   Skin:  Positive for rash.       Objective:   Physical Exam Constitutional:      Appearance: Normal appearance.  Cardiovascular:     Rate and Rhythm: Normal rate and regular rhythm.     Pulses: Normal pulses.     Heart sounds: Normal heart sounds.  Pulmonary:     Effort: Pulmonary effort is normal.     Breath sounds: Normal breath sounds.  Genitourinary:    Comments: There is a 1 cm area of red vesicles and papules on the left pubic area  Neurological:     Mental Status: He is alert.           Assessment & Plan:  Herpes genitalis. Treat with 10 days of Valtrex . Follow up as needed.  Garnette Olmsted, MD
